# Patient Record
Sex: Male | Born: 1965 | Race: White | Hispanic: No | Marital: Married | Smoking: Current every day smoker
Health system: Southern US, Community
[De-identification: ages and names within clinical notes are randomized; demographics above are authoritative.]

## PROBLEM LIST (undated history)

## (undated) DIAGNOSIS — B009 Herpesviral infection, unspecified: Secondary | ICD-10-CM

## (undated) DIAGNOSIS — J449 Chronic obstructive pulmonary disease, unspecified: Secondary | ICD-10-CM

## (undated) DIAGNOSIS — K219 Gastro-esophageal reflux disease without esophagitis: Secondary | ICD-10-CM

---

## 2001-06-17 HISTORY — PX: HERNIA REPAIR: SHX51

## 2015-05-29 ENCOUNTER — Ambulatory Visit (INDEPENDENT_AMBULATORY_CARE_PROVIDER_SITE_OTHER): Payer: Managed Care, Other (non HMO) | Admitting: Family Medicine

## 2015-05-29 ENCOUNTER — Encounter: Payer: Self-pay | Admitting: Family Medicine

## 2015-05-29 ENCOUNTER — Other Ambulatory Visit: Payer: Self-pay | Admitting: Family Medicine

## 2015-05-29 VITALS — BP 120/69 | HR 87 | Temp 98.3°F | Resp 16 | Ht 73.0 in | Wt 215.6 lb

## 2015-05-29 DIAGNOSIS — W57XXXA Bitten or stung by nonvenomous insect and other nonvenomous arthropods, initial encounter: Secondary | ICD-10-CM | POA: Diagnosis not present

## 2015-05-29 DIAGNOSIS — R058 Other specified cough: Secondary | ICD-10-CM

## 2015-05-29 DIAGNOSIS — L989 Disorder of the skin and subcutaneous tissue, unspecified: Secondary | ICD-10-CM | POA: Diagnosis not present

## 2015-05-29 DIAGNOSIS — S30860A Insect bite (nonvenomous) of lower back and pelvis, initial encounter: Secondary | ICD-10-CM | POA: Insufficient documentation

## 2015-05-29 DIAGNOSIS — R05 Cough: Secondary | ICD-10-CM

## 2015-05-29 NOTE — Progress Notes (Signed)
Name: Jesse Fischer   MRN: [id number]    DOB: Feb 24, 1966   Date:05/29/2015       Progress Note  Subjective  Chief Complaint  Chief Complaint  Patient presents with  . Establish Care    NP   Rash This is a chronic problem. Episode onset: 25 years ago, initial injury described as being penetrated by [place]. The problem has been gradually worsening (Has turned red in last 3 weeks.) since onset. The affected locations include the right ankle. The rash is characterized by pain. He was exposed to plant contact. Associated symptoms include coughing. Pertinent negatives include no anorexia, fatigue, fever, shortness of breath or sore throat. Past treatments include nothing. There is no history of asthma.  Cough This is a chronic problem. The current episode started more than 1 month ago (6 months ago). The problem has been unchanged. The cough is productive of sputum (has seen 'little dots of black' within the mucus). Associated symptoms include a rash. Pertinent negatives include no chest pain, chills, fever, headaches, hemoptysis, sore throat, shortness of breath, weight loss or wheezing. There is no history of asthma or COPD.   Pt. Also reports that this past summer, he experienced two tick bites, one on his right buttock area and the other one on the back of right leg. This was in [place]. He denies having any fevers, chills or other symptoms, no rashes. Thinks the ticks were there for less than an hour.  Pt. Is here to establish care.   History reviewed. No pertinent past medical history.  Past Surgical History  Procedure Laterality Date  . Hernia repair  2003    2 left inguinal hernias, 1 right inguinal hernia, 1 umbilical    Family History  Problem Relation Age of Onset  . Adopted: Yes    Social History   Social History  . Marital Status: Married    Spouse Name: N/A  . Number of Children: N/A  . Years of Education: N/A   Occupational  History  . Not on file.   Social History Main Topics  . Smoking status: Current Every Day Smoker -- 1.00 packs/day for 30 years    Types: Cigarettes  . Smokeless tobacco: Never Used  . Alcohol Use: No  . Drug Use: No  . Sexual Activity:    Partners: Female   Other Topics Concern  . Not on file   Social History Narrative  . No narrative on file    No current outpatient prescriptions on file.  Allergies  Allergen Reactions  . Wasp Venom     Review of Systems  Constitutional: Negative for fever, chills, weight loss, malaise/fatigue and fatigue.  HENT: Negative for sore throat.   Respiratory: Positive for cough and sputum production. Negative for hemoptysis, shortness of breath and wheezing.   Cardiovascular: Negative for chest pain and palpitations.  Gastrointestinal: Negative for anorexia.  Skin: Positive for itching and rash.  Neurological: Negative for headaches.   Objective  Filed Vitals:   05/29/15 1027  BP: 120/69  Pulse: 87  Temp: 98.3 F (36.8 C)  TempSrc: Oral  Resp: 16  Height:  (1.854 m)  Weight: 215 lb 9.6 oz (97.796 kg)  SpO2: 96%    Physical Exam  Constitutional: He is oriented to person, place, and time and well-developed, well-nourished, and in no distress.  HENT:  Head: Normocephalic and atraumatic.  Cardiovascular: Normal rate, regular rhythm and normal heart  sounds.   Pulmonary/Chest: Effort normal and breath sounds normal. He has no wheezes.  Neurological: He is alert and oriented to person, place, and time.  Skin: Lesion, purpura and rash noted.  maculo-papular, raised, erythematous, tender lesion superior to the right lateral malleolus. Pt. Has severe pain on palpation from superior to inferioir.  Nursing note and vitals reviewed.   Assessment & Plan  1. Skin lesion of right leg Obtain ultrasound of the affected area for evaluation and to rule out possible foreign body. - US Misc Soft Tissue; Future  2. Recurrent productive  cough Obtain chest x-ray for evaluation of recurrent productive cough. Consider referral to pulmonology if cough is persistent. - DG Chest 2 View; Future  3. Tick bite of buttock, initial encounter Obtain laboratory evaluation including white count, liver and kidney function, and Lyme disease antibody panel. Follow up after review of labs. - CBC with Differential - Comprehensive Metabolic Panel (CMET) - Lyme Aby, Western Blot IgG & IgM w/bands - Lyme Disease, IgM, Early Test w/ Rflx  Raychelle Hudman Asad A. Faylene KurtzShah [facility] 05/29/2015 10:55 AM

## 2015-05-30 ENCOUNTER — Other Ambulatory Visit: Payer: Self-pay | Admitting: Family Medicine

## 2015-05-30 DIAGNOSIS — L989 Disorder of the skin and subcutaneous tissue, unspecified: Secondary | ICD-10-CM

## 2015-05-30 LAB — CBC WITH DIFFERENTIAL/PLATELET
BASOS ABS: 0 10*3/uL (ref 0.0–0.2)
Basos: 1 %
EOS (ABSOLUTE): 0.2 10*3/uL (ref 0.0–0.4)
Eos: 2 %
Hematocrit: 46.1 % (ref 37.5–51.0)
Hemoglobin: 15.6 g/dL (ref 12.6–17.7)
IMMATURE GRANULOCYTES: 0 %
Immature Grans (Abs): 0 10*3/uL (ref 0.0–0.1)
Lymphocytes Absolute: 2.7 10*3/uL (ref 0.7–3.1)
Lymphs: 31 %
MCH: 30.6 pg (ref 26.6–33.0)
MCHC: 33.8 g/dL (ref 31.5–35.7)
MCV: 90 fL (ref 79–97)
MONOS ABS: 0.5 10*3/uL (ref 0.1–0.9)
Monocytes: 6 %
NEUTROS PCT: 60 %
Neutrophils Absolute: 5.3 10*3/uL (ref 1.4–7.0)
PLATELETS: 226 10*3/uL (ref 150–379)
RBC: 5.1 x10E6/uL (ref 4.14–5.80)
RDW: 13.7 % (ref 12.3–15.4)
WBC: 8.8 10*3/uL (ref 3.4–10.8)

## 2015-05-30 LAB — COMPREHENSIVE METABOLIC PANEL
A/G RATIO: 1.8 (ref 1.1–2.5)
ALT: 12 IU/L (ref 0–44)
AST: 10 IU/L (ref 0–40)
Albumin: 4.2 g/dL (ref 3.5–5.5)
Alkaline Phosphatase: 64 IU/L (ref 39–117)
BUN/Creatinine Ratio: 12 (ref 9–20)
BUN: 11 mg/dL (ref 6–24)
Bilirubin Total: 0.4 mg/dL (ref 0.0–1.2)
CALCIUM: 9.2 mg/dL (ref 8.7–10.2)
CO2: 23 mmol/L (ref 18–29)
Chloride: 105 mmol/L (ref 96–106)
Creatinine, Ser: 0.91 mg/dL (ref 0.76–1.27)
GFR calc Af Amer: 114 mL/min/{1.73_m2} (ref >59)
GFR, EST NON AFRICAN AMERICAN: 99 mL/min/{1.73_m2} (ref >59)
Globulin, Total: 2.3 g/dL (ref 1.5–4.5)
Glucose: 97 mg/dL (ref 65–99)
POTASSIUM: 4.7 mmol/L (ref 3.5–5.2)
Sodium: 141 mmol/L (ref 134–144)
Total Protein: 6.5 g/dL (ref 6.0–8.5)

## 2015-05-30 LAB — LYME, IGM, EARLY TEST/REFLEX: LYME DISEASE AB, QUANT, IGM: <0.8 index (ref 0.00–0.79)

## 2015-06-02 ENCOUNTER — Ambulatory Visit
Admission: RE | Admit: 2015-06-02 | Discharge: 2015-06-02 | Disposition: A | Discharge status: Home / Self Care | Payer: Managed Care, Other (non HMO) | Source: Ambulatory Visit | Attending: Family Medicine | Admitting: Family Medicine

## 2015-06-02 DIAGNOSIS — L989 Disorder of the skin and subcutaneous tissue, unspecified: Secondary | ICD-10-CM | POA: Insufficient documentation

## 2015-06-11 ENCOUNTER — Other Ambulatory Visit: Payer: Self-pay | Admitting: Family Medicine

## 2015-06-11 DIAGNOSIS — S80851A Superficial foreign body, right lower leg, initial encounter: Secondary | ICD-10-CM | POA: Insufficient documentation

## 2015-06-11 DIAGNOSIS — S80851S Superficial foreign body, right lower leg, sequela: Secondary | ICD-10-CM

## 2015-06-21 ENCOUNTER — Ambulatory Visit (INDEPENDENT_AMBULATORY_CARE_PROVIDER_SITE_OTHER): Payer: Managed Care, Other (non HMO) | Admitting: Family Medicine

## 2015-06-21 ENCOUNTER — Ambulatory Visit
Admission: RE | Admit: 2015-06-21 | Discharge: 2015-06-21 | Disposition: A | Discharge status: Home / Self Care | Payer: Managed Care, Other (non HMO) | Source: Ambulatory Visit | Attending: Family Medicine | Admitting: Family Medicine

## 2015-06-21 ENCOUNTER — Encounter: Payer: Self-pay | Admitting: Family Medicine

## 2015-06-21 VITALS — BP 118/72 | HR 86 | Temp 98.2°F | Resp 17 | Ht 73.0 in | Wt 222.8 lb

## 2015-06-21 DIAGNOSIS — R093 Abnormal sputum: Secondary | ICD-10-CM | POA: Insufficient documentation

## 2015-06-21 DIAGNOSIS — R058 Other specified cough: Secondary | ICD-10-CM

## 2015-06-21 DIAGNOSIS — R05 Cough: Secondary | ICD-10-CM

## 2015-06-21 DIAGNOSIS — S80851D Superficial foreign body, right lower leg, subsequent encounter: Secondary | ICD-10-CM

## 2015-06-21 NOTE — Progress Notes (Signed)
Name: Jesse Fischer   MRN: [id number]    DOB: 09/19/1965   Date:06/21/2015       Progress Note  Subjective  Chief Complaint  Chief Complaint  Patient presents with  . Follow-up    2 wk   Cough This is a new problem. The problem has been unchanged. The cough is productive of sputum (Sputum is clear except for brown/black specks diffused throughout.). Associated symptoms include shortness of breath and wheezing. Pertinent negatives include no chest pain, chills, ear congestion, ear pain, fever, hemoptysis, sore throat or weight loss. 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 He has tried nothing for the symptoms. There is no history of asthma, bronchiectasis, bronchitis, COPD or environmental allergies.    History reviewed. No pertinent past medical history.  Past Surgical History  Procedure Laterality Date  . Hernia repair  2003    2 left inguinal hernias, 1 right inguinal hernia, 1 umbilical    Family History  Problem Relation Age of Onset  . Adopted: Yes    Social History   Social History  . Marital Status: Married    Spouse Name: N/A  . Number of Children: N/A  . Years of Education: N/A   Occupational History  . Not on file.   Social History Main Topics  . Smoking status: Current Every Day Smoker -- 1.00 packs/day for 30 years    Types: Cigarettes  . Smokeless tobacco: Never Used  . Alcohol Use: No  . Drug Use: No  . Sexual Activity:    Partners: Female   Other Topics Concern  . Not on file   Social History Narrative    No current outpatient prescriptions on file.  Allergies  Allergen Reactions  . Wasp Venom     Review of Systems  Constitutional: Negative for fever, chills, weight loss and malaise/fatigue.  HENT: Negative for ear pain and sore throat.   Respiratory: Positive for cough, sputum production, shortness of breath and  wheezing. Negative for hemoptysis.   Cardiovascular: Negative for chest pain.  Endo/Heme/Allergies: Negative for environmental allergies.    Objective  Filed Vitals:   06/21/15 0958  BP: 118/72  Pulse: 86  Temp: 98.2 F (36.8 C)  TempSrc: Oral  Resp: 17  Height: 6\' 1"  (1.854 m)  Weight: 222 lb 12.8 oz (101.061 kg)  SpO2: 96%    Physical Exam  Constitutional: He is oriented to person, place, and time and well-developed, well-nourished, and in no distress.  HENT:  Head: Normocephalic and atraumatic.  Nose: Right sinus exhibits no maxillary sinus tenderness and no frontal sinus tenderness. Left sinus exhibits no maxillary sinus tenderness and no frontal sinus tenderness.  Mouth/Throat: Posterior oropharyngeal erythema present.  Left ear canal erythematous, no drainage.  Cardiovascular: Normal rate, regular rhythm and normal heart sounds.   Pulmonary/Chest: Effort normal and breath sounds normal.  Musculoskeletal:       Right lower leg: He exhibits no tenderness, no swelling and no edema.  Neurological: He is alert and oriented to person, place, and time.  Nursing note and vitals reviewed.   Assessment & Plan  1. Expectoration of abnormal sputum Unclear etiology for abnormality described in sputum. Associated dyspnea. We will obtain sputum culture for identification of any pathological organisms.Also obtain CXR to rule out any cardiopulmonary abnormality. - Sputum culture  2. Foreign body of right lower leg, subsequent encounter Ultrasound of right lower  extremity reviewed. Patient reports that the foreign body was a weed which came out spontaneously.the area of concern on the right lower leg appears normal. No further workup indicated at present. Follow-up as needed   Jesse Fischer Asad A. Faylene Kurt[facility] 06/21/2015 10:33 AM

## 2015-06-28 LAB — LOWER RESPIRATORY CULTURE

## 2016-12-23 ENCOUNTER — Emergency Department: Payer: Managed Care, Other (non HMO)

## 2016-12-23 ENCOUNTER — Encounter: Payer: Self-pay | Admitting: *Deleted

## 2016-12-23 ENCOUNTER — Emergency Department
Admission: EM | Admit: 2016-12-23 | Discharge: 2016-12-24 | Disposition: A | Discharge status: Home / Self Care | Payer: Managed Care, Other (non HMO) | Attending: Emergency Medicine | Admitting: Emergency Medicine

## 2016-12-23 DIAGNOSIS — F17210 Nicotine dependence, cigarettes, uncomplicated: Secondary | ICD-10-CM | POA: Insufficient documentation

## 2016-12-23 DIAGNOSIS — Z23 Encounter for immunization: Secondary | ICD-10-CM | POA: Insufficient documentation

## 2016-12-23 DIAGNOSIS — J449 Chronic obstructive pulmonary disease, unspecified: Secondary | ICD-10-CM | POA: Insufficient documentation

## 2016-12-23 DIAGNOSIS — T63441A Toxic effect of venom of bees, accidental (unintentional), initial encounter: Secondary | ICD-10-CM | POA: Insufficient documentation

## 2016-12-23 DIAGNOSIS — Z91038 Other insect allergy status: Secondary | ICD-10-CM | POA: Insufficient documentation

## 2016-12-23 DIAGNOSIS — R4182 Altered mental status, unspecified: Secondary | ICD-10-CM | POA: Insufficient documentation

## 2016-12-23 DIAGNOSIS — T782XXA Anaphylactic shock, unspecified, initial encounter: Secondary | ICD-10-CM | POA: Insufficient documentation

## 2016-12-23 DIAGNOSIS — R55 Syncope and collapse: Secondary | ICD-10-CM | POA: Insufficient documentation

## 2016-12-23 HISTORY — DX: Herpesviral infection, unspecified: B00.9

## 2016-12-23 HISTORY — DX: Gastro-esophageal reflux disease without esophagitis: K21.9

## 2016-12-23 HISTORY — DX: Chronic obstructive pulmonary disease, unspecified: J44.9

## 2016-12-23 LAB — CK: Total CK: 61 U/L (ref 49–397)

## 2016-12-23 LAB — URINALYSIS, COMPLETE (UACMP) WITH MICROSCOPIC
BILIRUBIN URINE: NEGATIVE
Bacteria, UA: NONE SEEN
Glucose, UA: NEGATIVE mg/dL
HGB URINE DIPSTICK: NEGATIVE
Ketones, ur: NEGATIVE mg/dL
LEUKOCYTES UA: NEGATIVE
Nitrite: NEGATIVE
PH: 5 (ref 5.0–8.0)
Protein, ur: 30 mg/dL — AB
RBC / HPF: NONE SEEN RBC/hpf (ref 0–5)
SPECIFIC GRAVITY, URINE: 1.014 (ref 1.005–1.030)

## 2016-12-23 LAB — CBC WITH DIFFERENTIAL/PLATELET
BASOS ABS: 0 10*3/uL (ref 0–0.1)
Basophils Relative: 0 %
EOS PCT: 1 %
Eosinophils Absolute: 0.1 10*3/uL (ref 0–0.7)
HCT: 45.3 % (ref 40.0–52.0)
HEMOGLOBIN: 15.6 g/dL (ref 13.0–18.0)
Lymphocytes Relative: 43 %
Lymphs Abs: 3.5 10*3/uL (ref 1.0–3.6)
MCH: 31.2 pg (ref 26.0–34.0)
MCHC: 34.4 g/dL (ref 32.0–36.0)
MCV: 90.6 fL (ref 80.0–100.0)
Monocytes Absolute: 0.4 10*3/uL (ref 0.2–1.0)
Monocytes Relative: 4 %
NEUTROS ABS: 4.1 10*3/uL (ref 1.4–6.5)
NEUTROS PCT: 52 %
PLATELETS: 220 10*3/uL (ref 150–440)
RBC: 5.01 MIL/uL (ref 4.40–5.90)
RDW: 14.3 % (ref 11.5–14.5)
WBC: 8.1 10*3/uL (ref 3.8–10.6)

## 2016-12-23 LAB — HEPATIC FUNCTION PANEL
ALT: 94 U/L — ABNORMAL HIGH (ref 17–63)
AST: 50 U/L — ABNORMAL HIGH (ref 15–41)
Albumin: 3.7 g/dL (ref 3.5–5.0)
Alkaline Phosphatase: 53 U/L (ref 38–126)
BILIRUBIN INDIRECT: 0.9 mg/dL (ref 0.3–0.9)
Bilirubin, Direct: 0.2 mg/dL (ref 0.1–0.5)
TOTAL PROTEIN: 6.7 g/dL (ref 6.5–8.1)
Total Bilirubin: 1.1 mg/dL (ref 0.3–1.2)

## 2016-12-23 LAB — URINE DRUG SCREEN, QUALITATIVE (ARMC ONLY)
Amphetamines, Ur Screen: NOT DETECTED
BARBITURATES, UR SCREEN: NOT DETECTED
Benzodiazepine, Ur Scrn: POSITIVE — AB
CANNABINOID 50 NG, UR ~~LOC~~: NOT DETECTED
Cocaine Metabolite,Ur ~~LOC~~: NOT DETECTED
MDMA (Ecstasy)Ur Screen: NOT DETECTED
Methadone Scn, Ur: NOT DETECTED
OPIATE, UR SCREEN: NOT DETECTED
Phencyclidine (PCP) Ur S: NOT DETECTED
TRICYCLIC, UR SCREEN: NOT DETECTED

## 2016-12-23 LAB — BASIC METABOLIC PANEL
ANION GAP: 12 (ref 5–15)
BUN: 21 mg/dL — ABNORMAL HIGH (ref 6–20)
CALCIUM: 8.8 mg/dL — AB (ref 8.9–10.3)
CO2: 21 mmol/L — ABNORMAL LOW (ref 22–32)
CREATININE: 1.25 mg/dL — AB (ref 0.61–1.24)
Chloride: 109 mmol/L (ref 101–111)
Glucose, Bld: 217 mg/dL — ABNORMAL HIGH (ref 65–99)
Potassium: 3.5 mmol/L (ref 3.5–5.1)
SODIUM: 142 mmol/L (ref 135–145)

## 2016-12-23 LAB — ETHANOL

## 2016-12-23 MED ORDER — TETANUS-DIPHTH-ACELL PERTUSSIS 5-2.5-18.5 LF-MCG/0.5 IM SUSP
0.5000 mL | Freq: Once | INTRAMUSCULAR | Status: AC
  Administered 2016-12-23: 0.5 mL via INTRAMUSCULAR
  Filled 2016-12-23: qty 0.5

## 2016-12-23 MED ORDER — HALOPERIDOL LACTATE 5 MG/ML IJ SOLN
5.0000 mg | Freq: Once | INTRAMUSCULAR | Status: AC
  Administered 2016-12-23: 5 mg via INTRAVENOUS

## 2016-12-23 MED ORDER — HALOPERIDOL LACTATE 5 MG/ML IJ SOLN
INTRAMUSCULAR | Status: AC
  Filled 2016-12-23: qty 1

## 2016-12-23 MED ORDER — SODIUM CHLORIDE 0.9 % IV BOLUS (SEPSIS)
2000.0000 mL | Freq: Once | INTRAVENOUS | Status: AC
  Administered 2016-12-23: 2000 mL via INTRAVENOUS

## 2016-12-23 NOTE — ED Notes (Signed)
Pt is now singing.

## 2016-12-23 NOTE — ED Notes (Signed)
Pt asking what happened, somewhat confused to the event   No rash.  No resp distress.  Pt alert.

## 2016-12-23 NOTE — ED Notes (Signed)
Pt argumentative with wife over cell phone.  md aware.  Pt loud at times.  Pt waiting on admission bed.  meds given.  Pt cooperative at this time.  Iv in place.  nsr on monitor.

## 2016-12-23 NOTE — ED Notes (Signed)
SOC computer placed in patient room.  

## 2016-12-23 NOTE — ED Notes (Signed)
Patient asked if he arrived by ambulance. Pt informed that he was. Pt's wife informed this RN that patient had asked that question repeatedly today

## 2016-12-23 NOTE — ED Notes (Signed)
Report off to jenna rn  

## 2016-12-23 NOTE — ED Triage Notes (Signed)
Pt brought in via ems from home with multiple beestings to body. Iv in place.  meds given by ems  md at bedside.  No resp distress. Pt alert.

## 2016-12-23 NOTE — ED Notes (Signed)
Patient's spouse left room and stated to this RN: "He's crazy, we're leaving".  Pt's stepson had already left room.   RN to room. Pt had several cables disconnected. Pt apoligized to this RN for disconnecting cables as soon as Charity fundraiserN entered. Pt stated: "I just got so angry. He just has no respect for his mother. I'm just not used to that.  I just got up so quickly, and he ran".  RN asked patient why he got up abruptly - pt reported to deal with child, however child left the room.   MD informed

## 2016-12-23 NOTE — ED Notes (Signed)
primedoc in with pt for admission.  

## 2016-12-23 NOTE — ED Notes (Signed)
Patient's family returned. Pt given meal tray and PO fluids

## 2016-12-23 NOTE — ED Provider Notes (Signed)
[facility] Emergency Department Provider Note  ____________________________________________   First MD Initiated Contact with Patient 12/23/16 1541     (approximate)  I have reviewed the triage vital signs and the nursing notes.   HISTORY  Chief Complaint Allergic Reaction  Level V exemption history Limited by the patient's likely intoxication   HPI Jesse Fischer is a 51 y.o. male who comes to the emergency department via EMS after being found down unconscious in a yard. Per reports he may have disturbed a bee's nest and may have been stung multiple times. Bystanders found him on the ground minimally responsive. When EMS arrived his blood pressure was 66 systolic and tachycardic to 122. His given 0.3 mg of epinephrine intramuscularly as well as 125 mg of Solu-Medrol 50 mg of Benadryl and 20 mg of famotidine.   Past Medical History:  Diagnosis Date  . COPD (chronic obstructive pulmonary disease) (HCC)   . GERD (gastroesophageal reflux disease)   . Herpes     Patient Active Problem List   Diagnosis Date Noted  . Expectoration of abnormal sputum 06/21/2015  . Foreign body of right lower leg 06/11/2015  . Skin lesion of right leg 05/29/2015  . Recurrent productive cough 05/29/2015  . Tick bite of buttock 05/29/2015    Past Surgical History:  Procedure Laterality Date  . HERNIA REPAIR  2003   2 left inguinal hernias, 1 right inguinal hernia, 1 umbilical    Prior to Admission medications   Not on File    Allergies Wasp venom  Family History  Problem Relation Age of Onset  . Adopted: Yes    Social History Social History  Substance Use Topics  . Smoking status: Current Every Day Smoker    Packs/day: 1.00    Years: 30.00    Types: Cigarettes  . Smokeless tobacco: Never Used  . Alcohol use 0.0 oz/week    Review of Systems Level V exemption history Limited by the patient's altered mental  status  ____________________________________________   PHYSICAL EXAM:  VITAL SIGNS: ED Triage Vitals  Enc Vitals Group     BP      Pulse      Resp      Temp      Temp src      SpO2      Weight      Height      Head Circumference      Peak Flow      Pain Score      Pain Loc      Pain Edu?      Excl. in GC?     Constitutional: No acute distress repetitive questioning bizarre behavior very confused Eyes: PERRL EOMI. pupils midrange Head: Atraumatic. Nose: No congestion/rhinnorhea. Mouth/Throat: No trismus Neck: No stridor.   Cardiovascular: Tachycardic rate, regular rhythm. Grossly normal heart sounds.  Good peripheral circulation. Respiratory: Normal respiratory effort.  No retractions. Lungs CTAB and moving good air Gastrointestinal: Soft nontender Musculoskeletal: No lower extremity edema   Neurologic:  No gross focal neurologic deficits are appreciated. Skin:  Skin is warm, dry and intact. Multiple stings noted to bilateral lower extremities. Psychiatric: Bizarre behavior intermittently aggressive and explosive    ____________________________________________   DIFFERENTIAL includes but not limited to  Intracerebral hemorrhage, stroke, transient ischemic attack, syncope, anaphylaxis, primary psychiatric condition, metabolic drainage, drug overdose ____________________________________________   LABS (all labs ordered are listed, but only abnormal results are displayed)  Labs Reviewed  BASIC METABOLIC PANEL -  Abnormal; Notable for the following:       Result Value   CO2 21 (*)    Glucose, Bld 217 (*)    BUN 21 (*)    Creatinine, Ser 1.25 (*)    Calcium 8.8 (*)    All other components within normal limits  HEPATIC FUNCTION PANEL - Abnormal; Notable for the following:    AST 50 (*)    ALT 94 (*)    All other components within normal limits  URINALYSIS, COMPLETE (UACMP) WITH MICROSCOPIC - Abnormal; Notable for the following:    Color, Urine YELLOW (*)     APPearance CLEAR (*)    Protein, ur 30 (*)    Squamous Epithelial / LPF 0-5 (*)    All other components within normal limits  URINE DRUG SCREEN, QUALITATIVE ([facility] ONLY) - Abnormal; Notable for the following:    Benzodiazepine, Ur Scrn POSITIVE (*)    All other components within normal limits  ETHANOL  CBC WITH DIFFERENTIAL/PLATELET  CK    Positive for benzodiazepines __________________________________________  EKG   ____________________________________________  RADIOLOGY  Head CT and C-spine CT without acute disease chest x-ray negative ____________________________________________   PROCEDURES  Procedure(s) performed: no  Procedures  Critical Care performed: no  Observation: no ____________________________________________   INITIAL IMPRESSION / ASSESSMENT AND PLAN / ED COURSE  Pertinent labs & imaging results that were available during my care of the patient were reviewed by me and considered in my medical decision making (see chart for details).  The patient arrives profoundly confused with repetitive questioning. Fortunately his exam is unremarkable. She does appear to have several bee stings but not a significant number. Given the possible history of collapse and trauma I will obtain advanced imaging.     ----------------------------------------- 6:40 PM on 12/23/2016 -----------------------------------------  The patient's wife is at bedside and able to provide some collateral history. She says the patient is absolutely not at his baseline right now and is behaving bizarre. She was able to call her friend who was the one who found the patient and the patient was at his house to collect junk to take the prescription. While there he was stung by multiple bees and shortly thereafter collapsed. This is when he called 911 and EMS found him hypotensive and tachycardic. The weight the patient is repeating questions raises the concern for traumatic brain injury and  concussion. I discussed the case with the hospitalist to has kindly left a consult, however we both agree at this point the patient does not warrant inpatient admission for his altered mental status. This is likely either secondary to traumatic brain injury versus primary psychiatric issue. ____________________________________________   FINAL CLINICAL IMPRESSION(S) / ED DIAGNOSES  Final diagnoses:  Altered mental status, unspecified altered mental status type      NEW MEDICATIONS STARTED DURING THIS VISIT:  New Prescriptions   No medications on file     Note:  This document was prepared using Dragon voice recognition software and may include unintentional dictation errors.     Merrily Brittleifenbark, Kodee Ravert, MD 12/23/16 [phone]

## 2016-12-23 NOTE — ED Notes (Signed)
Patient transported to CT 

## 2016-12-23 NOTE — ED Notes (Signed)
Pt having trouble remembering what happened today.  Ems report pt was unresponsive at scene from multiple beestings.  Pt reports a headache.  Speech clear.  md at bedside.

## 2016-12-23 NOTE — ED Notes (Signed)
Pt has urinary incontinence   Pt still somewhat confused   nsr on monitor.  Pt alert.  No resp distress.

## 2016-12-23 NOTE — Consult Note (Signed)
[employer] - [place] at [facility]   PATIENT NAME: Jesse Fischer    MR#:  [id number]  DATE OF BIRTH:  [birth year]  DATE OF ADMISSION:  12/23/2016  PRIMARY CARE PHYSICIAN: Ellyn Hack, MD   CONSULT REQUESTING/REFERRING PHYSICIAN: Dr. Lamont Snowball  REASON FOR CONSULT: Confusion  CHIEF COMPLAINT:   Chief Complaint  Patient presents with  . Allergic Reaction    HISTORY OF PRESENT ILLNESS:  Jesse Fischer  is a 51 y.o. male with a known history of COPD, GERD presented to the emergency room after EMS found him in the yard but bee stings. He was given epinephrine and Benadryl and recovered quickly. Initially was tachycardic and hypotensive and now feels back to normal. He has been found to be on and off confused with tangential thoughts. He was also found to be singing briefly in the emergency room. On discussing with him patient mentions that he was mowing the yard. Does not remember the actual events after the bee stings. He also mentions that he still Xanax and took it today. Now his vital signs are stable. Seems restless. Wife and son at bedside.   PAST MEDICAL HISTORY:   Past Medical History:  Diagnosis Date  . COPD (chronic obstructive pulmonary disease) (HCC)   . GERD (gastroesophageal reflux disease)   . Herpes     PAST SURGICAL HISTOIRY:   Past Surgical History:  Procedure Laterality Date  . HERNIA REPAIR  2003   2 left inguinal hernias, 1 right inguinal hernia, 1 umbilical    SOCIAL HISTORY:   Social History  Substance Use Topics  . Smoking status: Current Every Day Smoker    Packs/day: 1.00    Years: 30.00    Types: Cigarettes  . Smokeless tobacco: Never Used  . Alcohol use 0.0 oz/week    FAMILY HISTORY:   Family History  Problem Relation Age of Onset  . Adopted: Yes    DRUG ALLERGIES:   Allergies  Allergen Reactions  . Wasp Venom     REVIEW OF SYSTEMS:   ROS  CONSTITUTIONAL: No fever, fatigue or weakness.  EYES: No blurred or  double vision.  EARS, NOSE, AND THROAT: No tinnitus or ear pain.  RESPIRATORY: No cough, shortness of breath, wheezing or hemoptysis.  CARDIOVASCULAR: No chest pain, orthopnea, edema.  GASTROINTESTINAL: No nausea, vomiting, diarrhea or abdominal pain.  GENITOURINARY: No dysuria, hematuria.  ENDOCRINE: No polyuria, nocturia,  HEMATOLOGY: No anemia, easy bruising or bleeding SKIN: No rash or lesion. MUSCULOSKELETAL: No joint pain or arthritis.   NEUROLOGIC: No tingling, numbness, weakness.  PSYCHIATRY: Anxious  MEDICATIONS AT HOME:   Prior to Admission medications   Not on File      VITAL SIGNS:  Blood pressure (!) 154/98, pulse (!) 101, temperature 98.7 F (37.1 C), temperature source Oral, resp. rate 15, height 6\' 1"  (1.854 m), weight 90.7 kg (200 lb), SpO2 96 %.  PHYSICAL EXAMINATION:  GENERAL:  51 y.o.-year-old patient lying in the bed with no acute distress.  EYES: Pupils equal, round, reactive to light and accommodation. No scleral icterus. Extraocular muscles intact.  HEENT: Head atraumatic, normocephalic. Oropharynx and nasopharynx clear.  NECK:  Supple, no jugular venous distention. No thyroid enlargement, no tenderness.  LUNGS: Normal breath sounds bilaterally, no wheezing, rales,rhonchi or crepitation. No use of accessory muscles of respiration.  CARDIOVASCULAR: S1, S2 normal. No murmurs, rubs, or gallops.  ABDOMEN: Soft, nontender, nondistended. Bowel sounds present. No organomegaly or mass.  EXTREMITIES: No pedal edema, cyanosis,  or clubbing.  NEUROLOGIC: Cranial nerves II through XII are intact. Muscle strength 5/5 in all extremities. Sensation intact. Gait not checked.  PSYCHIATRIC: The patient is alert and oriented x 3.  SKIN: No obvious rash, lesion, or ulcer.   LABORATORY PANEL:   CBC  Recent Labs Lab 12/23/16 1541  WBC 8.1  HGB 15.6  HCT 45.3  PLT 220    ------------------------------------------------------------------------------------------------------------------  Chemistries   Recent Labs Lab 12/23/16 1541  NA 142  K 3.5  CL 109  CO2 21*  GLUCOSE 217*  BUN 21*  CREATININE 1.25*  CALCIUM 8.8*  AST 50*  ALT 94*  ALKPHOS 53  BILITOT 1.1   ------------------------------------------------------------------------------------------------------------------  Cardiac Enzymes No results for input(s): TROPONINI in the last 168 hours. ------------------------------------------------------------------------------------------------------------------  RADIOLOGY:  Ct Head Wo Contrast  Result Date: 12/23/2016 CLINICAL DATA:  51 y/o M; status post fall, altered mental status, headache. EXAM: CT HEAD WITHOUT CONTRAST CT CERVICAL SPINE WITHOUT CONTRAST TECHNIQUE: Multidetector CT imaging of the head and cervical spine was performed following the standard protocol without intravenous contrast. Multiplanar CT image reconstructions of the cervical spine were also generated. COMPARISON:  None. FINDINGS: CT HEAD FINDINGS Brain: No evidence of acute infarction, hemorrhage, hydrocephalus, extra-axial collection or mass lesion/mass effect. Vascular: No hyperdense vessel or unexpected calcification. Skull: Normal. Negative for fracture or focal lesion. Sinuses/Orbits: No acute finding. Other: None. CT CERVICAL SPINE FINDINGS Alignment: Straightening of cervical lordosis.  No listhesis. Skull base and vertebrae: No acute fracture. No primary bone lesion or focal pathologic process. Soft tissues and spinal canal: No prevertebral fluid or swelling. No visible canal hematoma. Disc levels: Cervical spondylosis is greatest at the C5 through C7 levels with there are moderate discogenic degenerative changes with loss of disc space height and marginal osteophytes. Additionally, uncovertebral and facet hypertrophy at those levels mildly narrows the bony neural foramen.  No high-grade bony canal stenosis. Upper chest: Negative. Other: Negative. IMPRESSION: 1. No acute intracranial abnormality.  Unremarkable CT of the head. 2. No acute fracture or dislocation of the cervical spine. 3. Cervical spondylosis greatest at the C5 through C7 levels. No high-grade bony canal stenosis. Electronically Signed   By: Mitzi Hansen M.D.   On: 12/23/2016 16:15   Ct Cervical Spine Wo Contrast  Result Date: 12/23/2016 CLINICAL DATA:  51 y/o M; status post fall, altered mental status, headache. EXAM: CT HEAD WITHOUT CONTRAST CT CERVICAL SPINE WITHOUT CONTRAST TECHNIQUE: Multidetector CT imaging of the head and cervical spine was performed following the standard protocol without intravenous contrast. Multiplanar CT image reconstructions of the cervical spine were also generated. COMPARISON:  None. FINDINGS: CT HEAD FINDINGS Brain: No evidence of acute infarction, hemorrhage, hydrocephalus, extra-axial collection or mass lesion/mass effect. Vascular: No hyperdense vessel or unexpected calcification. Skull: Normal. Negative for fracture or focal lesion. Sinuses/Orbits: No acute finding. Other: None. CT CERVICAL SPINE FINDINGS Alignment: Straightening of cervical lordosis.  No listhesis. Skull base and vertebrae: No acute fracture. No primary bone lesion or focal pathologic process. Soft tissues and spinal canal: No prevertebral fluid or swelling. No visible canal hematoma. Disc levels: Cervical spondylosis is greatest at the C5 through C7 levels with there are moderate discogenic degenerative changes with loss of disc space height and marginal osteophytes. Additionally, uncovertebral and facet hypertrophy at those levels mildly narrows the bony neural foramen. No high-grade bony canal stenosis. Upper chest: Negative. Other: Negative. IMPRESSION: 1. No acute intracranial abnormality.  Unremarkable CT of the head. 2. No acute fracture or dislocation of  the cervical spine. 3. Cervical  spondylosis greatest at the C5 through C7 levels. No high-grade bony canal stenosis. Electronically Signed   By: Mitzi HansenLance  Furusawa-Stratton M.D.   On: 12/23/2016 16:15   Dg Chest Port 1 View  Result Date: 12/23/2016 CLINICAL DATA:  51 y/o  M; shortness of breath after wasp sting. EXAM: PORTABLE CHEST 1 VIEW COMPARISON:  06/21/2015 chest radiograph FINDINGS: Stable heart size and mediastinal contours are within normal limits. Both lungs are clear. The visualized skeletal structures are unremarkable. IMPRESSION: No active disease. Electronically Signed   By: Mitzi HansenLance  Furusawa-Stratton M.D.   On: 12/23/2016 16:01    EKG:  No orders found for this or any previous visit.  IMPRESSION AND PLAN:   * Beestings With severe allergy reaction Patient has been appropriately treated with epinephrine, Benadryl. He has recovered with normal blood pressure and heart rate at this time. No shortness of breath or wheezing.  * Confusion. Likely substance abuse or psychosis. Would recommend psychiatric evaluation If this happens again. No focal neurological abnormalities on examination. Patient does mention that he stole Xanax earlier today.  * History of COPD. States he quit smoking 3 days back. No wheezing. No shortness of breath.  All the records are reviewed and case discussed with Consulting provider. Management plans discussed with the patient, family and they are in agreement.  TOTAL TIME TAKING CARE OF THIS PATIENT: 40 minutes.   Milagros LollSudini, Saphyre Cillo R M.D on 12/23/2016 at 6:27 PM  Between 7am to 6pm - Pager - [phone]  After 6pm go to [email or web address] - password [email or web address] [facility]  [facility]  Office  [phone]  CC: Primary care Physician: Ellyn HackShah, Syed Asad A, MD     Note: This dictation was prepared with Dragon dictation along with smaller phrase technology. Any transcriptional errors that result from this process are unintentional.

## 2016-12-24 MED ORDER — PREDNISONE 20 MG PO TABS: 60.0000 mg | ORAL_TABLET | Freq: Every day | ORAL | 0 refills | Status: AC

## 2016-12-24 MED ORDER — EPINEPHRINE 0.3 MG/0.3ML IJ SOAJ: 0.3000 mg | Freq: Once | INTRAMUSCULAR | 0 refills | Status: AC

## 2016-12-24 NOTE — ED Provider Notes (Addendum)
-----------------------------------------   2:13 AM on 12/24/2016 -----------------------------------------   Blood pressure 128/75, pulse 80, temperature 98.3 F (36.8 C), temperature source Oral, resp. rate 15, height 6\' 1"  (1.854 m), weight 90.7 kg (200 lb), SpO2 97 %.  Assuming care from Dr. Lamont Snowballifenbark.  In short, Jesse Fischer is a 51 y.o. male with a chief complaint of Allergic Reaction .  Refer to the original H&P for additional details.  The current plan of care is to follow the recommendations of the specialist on call psychiatry.     The patient received his specialist on-call consult. They feel that the patient can be discharged home with support. The patient is not currently in any acute or immediate danger to himself or others and does not meet any criteria for psychiatric admission. He understands the nature of his illness and the need for treatment as an outpatient. The patient is requesting discharge to home and his workup is unremarkable. He will be discharged to follow-up with [facility]. The patient's vital signs have been unremarkable while he is in my care. Although he did initially arrived with some abnormal vital signs it appears he has improved with multiple hours of care in the emergency department. I will discharge the patient home with an EpiPen in the event that his collapse was due to anaphylaxis as well as some prednisone.   Rebecka ApleyWebster, Kabria Hetzer P, MD 12/24/16 0214    Rebecka ApleyWebster, Grisela Mesch P, MD 12/24/16 [phone]

## 2016-12-24 NOTE — ED Notes (Signed)
Reviewed d/c instructions, follow-up care, prescriptions with patient and spouse. Pt/spouse verbalized understanding

## 2016-12-24 NOTE — Discharge Instructions (Signed)
Please follow-up with your primary care physician for further evaluation as he may have passed out due to a bee sting. [facility]

## 2017-08-10 IMAGING — US US EXTREM LOW*R* LIMITED
1 series · 14 of 19 positions shown · non-contrast
Comparison: NONE

CLINICAL DATA: Right lower extremity lesion. Rule out foreign body.

EXAM:
ULTRASOUND RIGHT LOWER EXTREMITY LIMITED
TECHNIQUE: Ultrasound examination of the lower extremity soft tissues was
performed in the area of clinical concern.

[Series 1: us extrem low*right* limited · 0.05mm/px · 19 acquisitions, 14 frames shown]
[im 1/19]
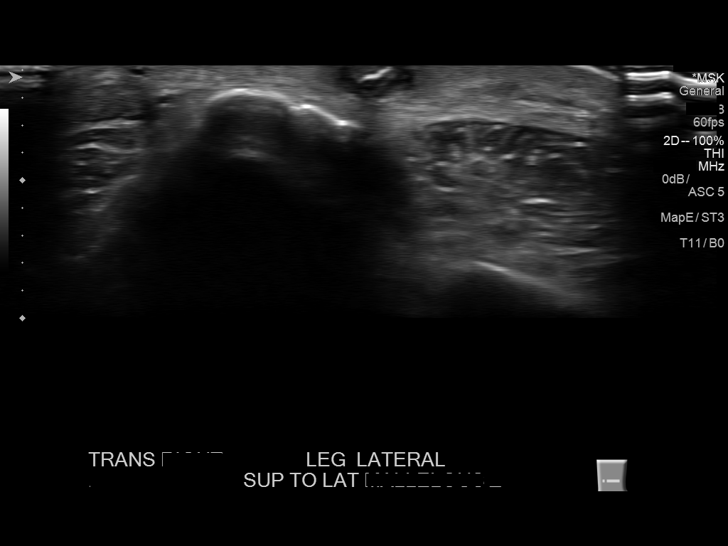
[im 3/19]
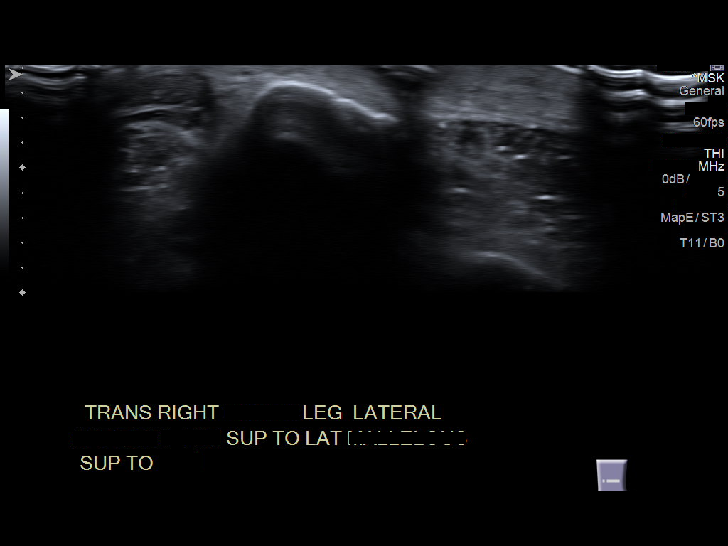
[im 4/19]
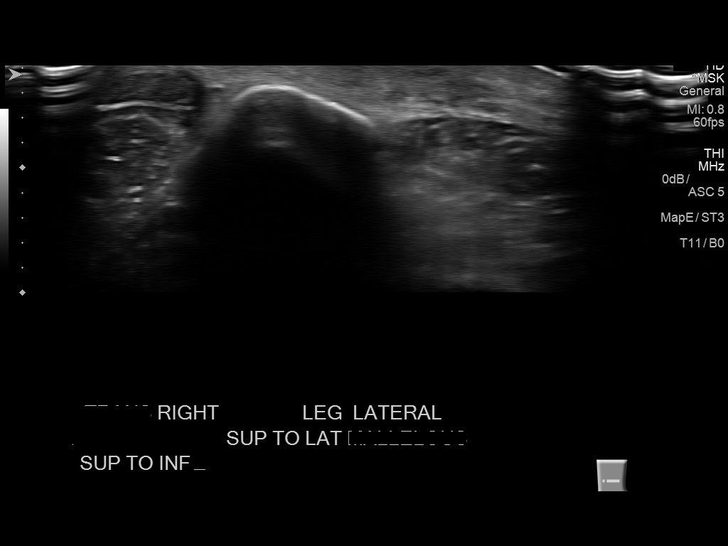
[im 5/19]
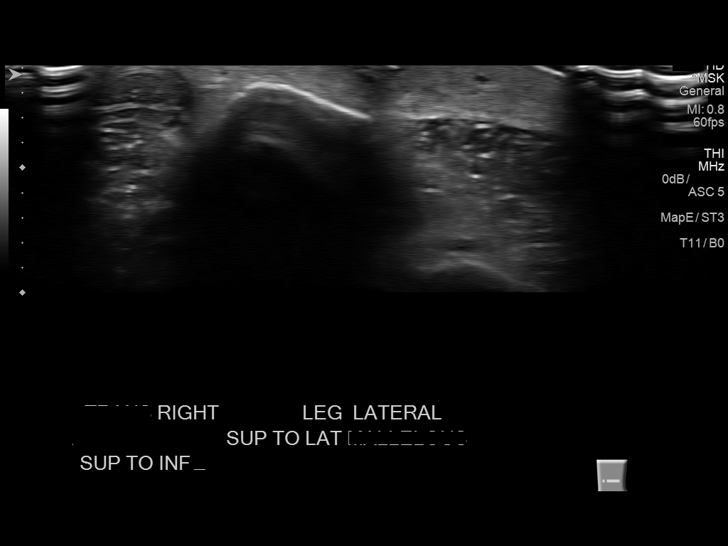
[im 7/19]
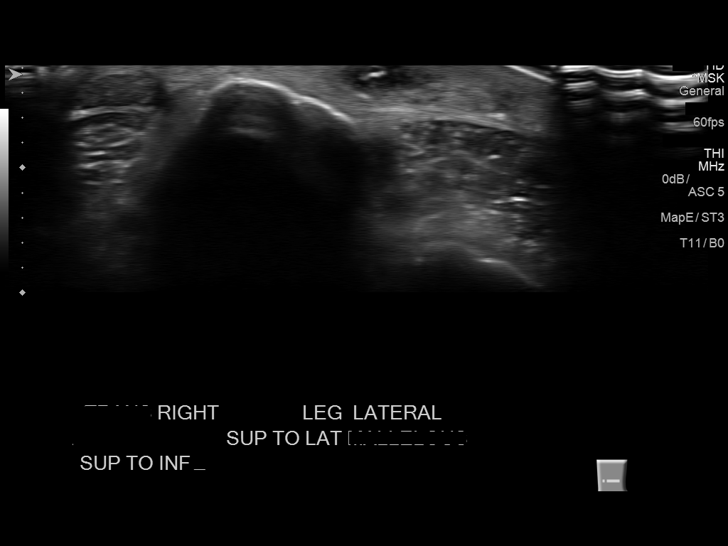
[im 8/19]
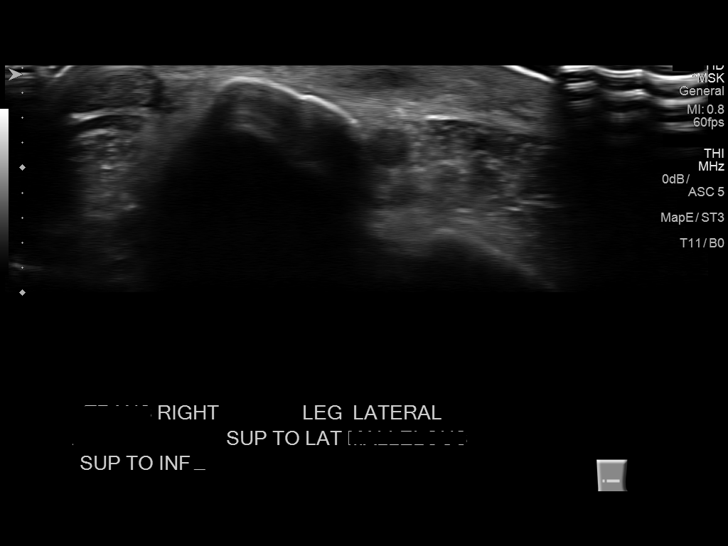
[im 9/19]
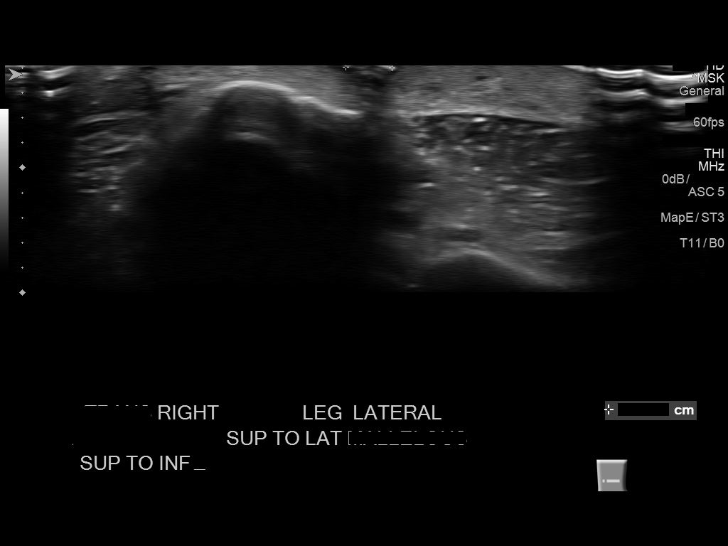
[im 11/19]
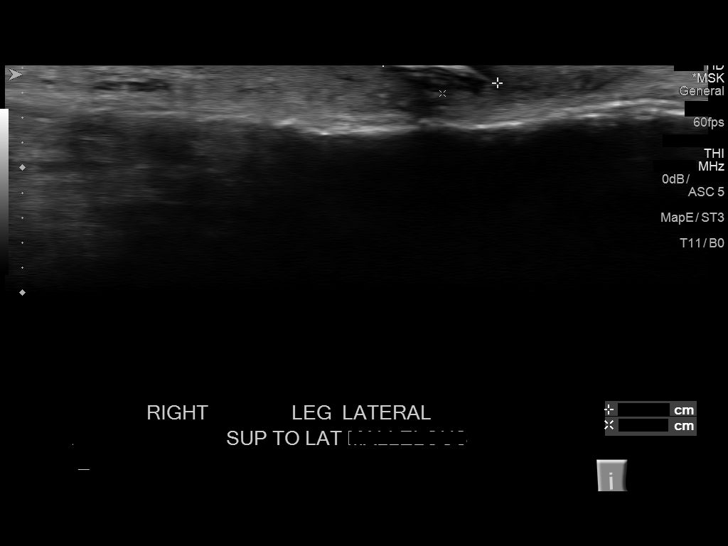
[im 12/19]
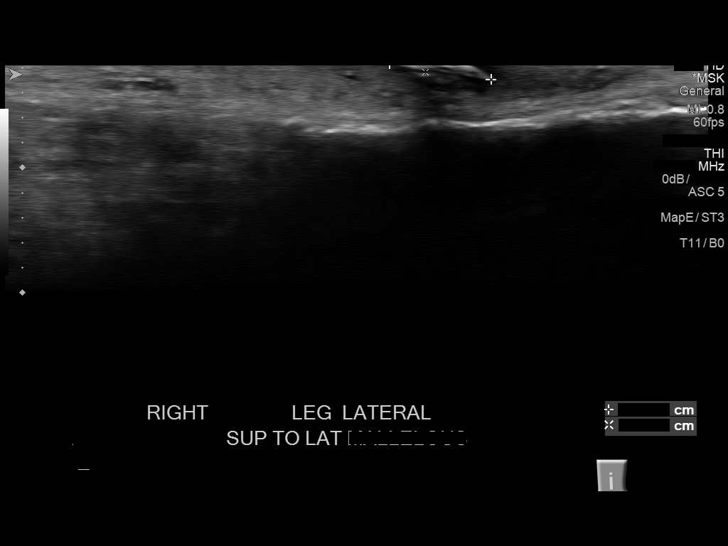
[im 13/19]
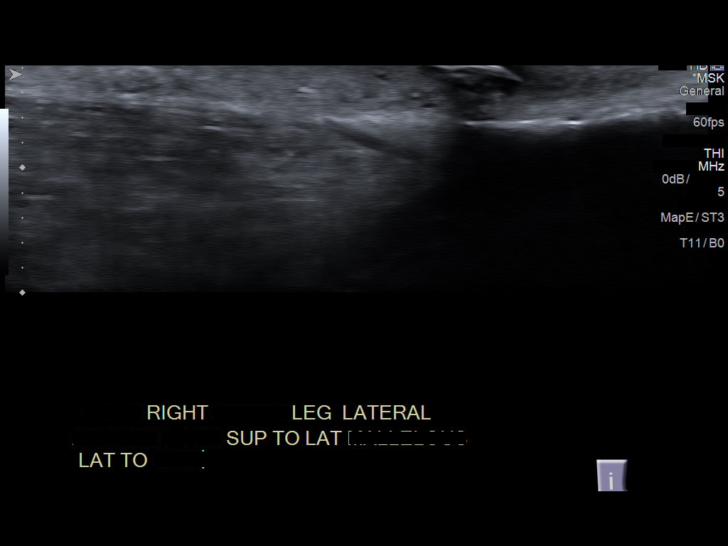
[im 15/19]
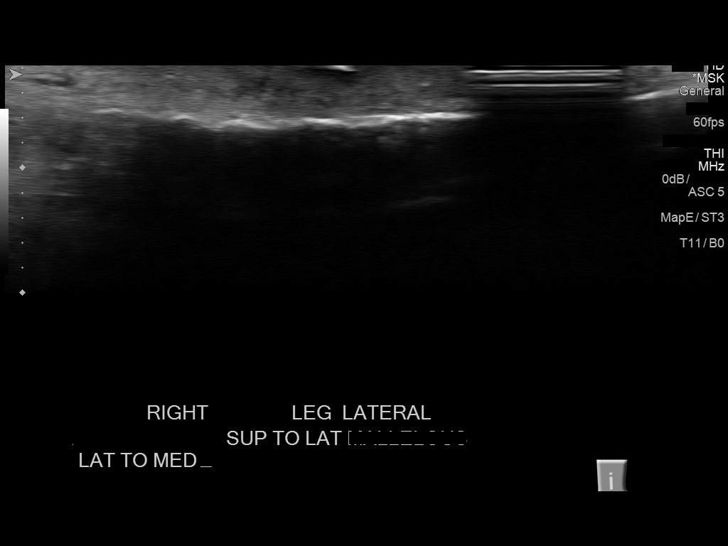
[im 16/19]
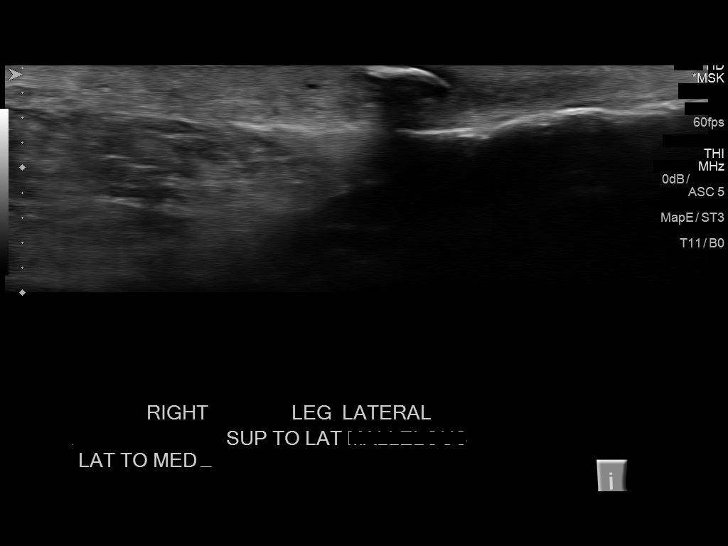
[im 17/19]
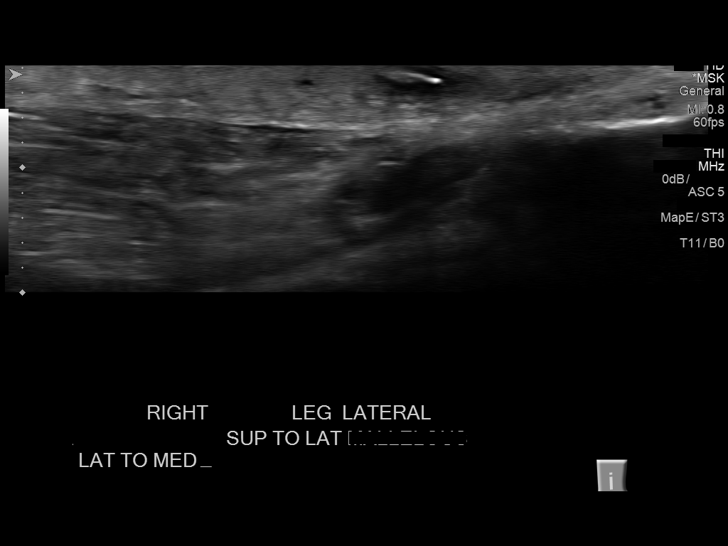
[im 19/19]
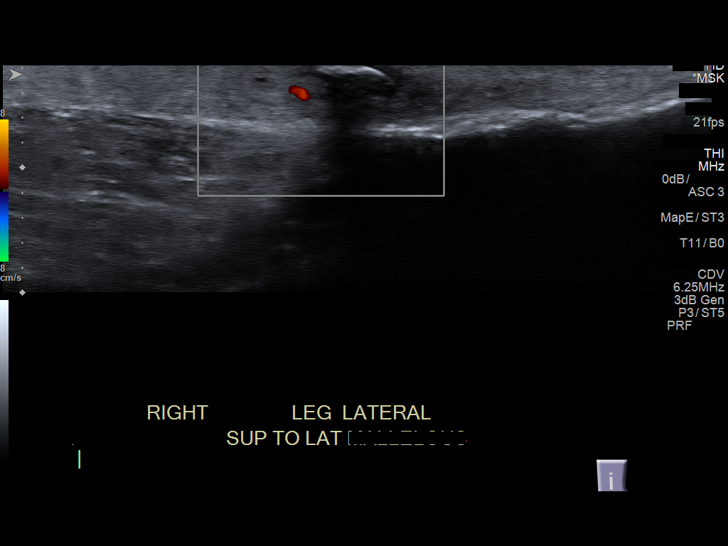

[14 of 19 positions shown; findings below may reference images not displayed]

FINDINGS: Anatomic area evaluated. Lateral aspect of lower right leg.
Approximately 3-4 cm proximal to the lateral malleolus.

Soft tissue mass: No

Complex fluid collection:  Yes

Other: There is a fluid collection in the area of concern measuring
9 x 3 x 6 mm. Within this fluid collection is and echogenic foreign
body measuring 8 x 1 x 4 mm.
IMPRESSION: Ultrasound confirms presence of fluid collection in the area of
concern which contains a small foreign body measuring 8 x 1 x 4 mm.

These results will be called to the ordering clinician or
representative by the Radiologist Assistant, and communication
documented in the PACS or zVision Dashboard.

## 2021-10-15 DEATH — deceased
